# Patient Record
Sex: Female | Born: 1990 | Race: White | Hispanic: No | Marital: Single | Smoking: Current every day smoker
Health system: Southern US, Community
[De-identification: ages and names within clinical notes are randomized; demographics above are authoritative.]

---

## 2012-02-21 ENCOUNTER — Encounter (HOSPITAL_COMMUNITY): Payer: Self-pay | Admitting: *Deleted

## 2012-02-21 ENCOUNTER — Emergency Department (HOSPITAL_COMMUNITY)
Admission: EM | Admit: 2012-02-21 | Discharge: 2012-02-21 | Disposition: A | Discharge status: Home / Self Care | Payer: Self-pay | Attending: Emergency Medicine | Admitting: Emergency Medicine

## 2012-02-21 DIAGNOSIS — K0889 Other specified disorders of teeth and supporting structures: Secondary | ICD-10-CM

## 2012-02-21 DIAGNOSIS — K089 Disorder of teeth and supporting structures, unspecified: Secondary | ICD-10-CM | POA: Insufficient documentation

## 2012-02-21 MED ORDER — PENICILLIN V POTASSIUM 500 MG PO TABS: 500.0000 mg | ORAL_TABLET | Freq: Four times a day (QID) | ORAL | Status: AC

## 2012-02-21 MED ORDER — OXYCODONE-ACETAMINOPHEN 5-325 MG PO TABS: 1.0000 | ORAL_TABLET | ORAL | Status: AC | PRN

## 2012-02-21 NOTE — ED Provider Notes (Signed)
History  This chart was scribed for Joya Gaskins, MD by Shari Heritage. The patient was seen in room . Patient's care was started at 0915.     CSN: [id number]  Arrival date & time 02/21/12  0913   First MD Initiated Contact with Patient 02/21/12 0915      Chief Complaint  Patient presents with  . Dental Pain    Patient is a 21 y.o. female presenting with tooth pain. The history is provided by the patient. No language interpreter was used.  Dental PainThe primary symptoms include mouth pain. Primary symptoms do not include fever. The symptoms began 5 to 7 days ago. The symptoms are unchanged. The symptoms are new. The symptoms occur constantly.  Mouth pain began 5 - 7 days ago. Mouth pain occurs constantly. Mouth pain is unchanged. Affected locations include: teeth.    HPI Comments: Heidi Trujillo is a 21 y.o. female who presents to the Emergency Department complaining of moderate, constant, non-radiating left lower dental pain onset 1 week ago. Patient denies fever, nausea or vomiting. She states that she may have a filling missing. She says that she has tried to make an appointment with a dentist, but has been unsuccessful so far. She reports no other significant medical history. She has no known allergies to medication.  PMH - none  No past surgical history on file.  No family history on file.  History  Substance Use Topics  . Smoking status: Not on file  . Smokeless tobacco: Not on file  . Alcohol Use: Not on file    OB History    No data available      Review of Systems  Constitutional: Negative for fever.  HENT: Positive for dental problem.   Gastrointestinal: Negative for nausea and vomiting.    Allergies  Review of patient's allergies indicates not on file.  Home Medications  No current outpatient prescriptions on file.  BP 110/58  Pulse 88  Temp 98.4 F (36.9 C) (Oral)  Resp 16  Ht 5\' 4"  (1.626 m)  Wt 135 lb (61.236 kg)  BMI 23.17 kg/m2  SpO2  96%  Physical Exam CONSTITUTIONAL: Well developed/well nourished HEAD AND FACE: Normocephalic/atraumatic EYES: EOMI/PERRL ENMT: Mucous membranes moist.  No trismus.  No focal abscess noted. NECK: supple no meningeal signs CV: S1/S2 noted, no murmurs/rubs/gallops noted LUNGS: Lungs are clear to auscultation bilaterally, no apparent distress ABDOMEN: soft, nontender, no rebound or guarding NEURO: Pt is awake/alert, moves all extremitiesx4 EXTREMITIES:full ROM SKIN: warm, color normal   ED Course  Procedures  DIAGNOSTIC STUDIES: Oxygen Saturation is 96% on room air, adequate by my interpretation.    COORDINATION OF CARE: 9:27am- Patient informed of current plan for treatment and evaluation and agrees with plan at this time.        MDM  Nursing notes including past medical history and social history reviewed and considered in documentation .      I personally performed the services described in this documentation, which was scribed in my presence. The recorded information has been reviewed and considered.      Joya Gaskins, MD 02/21/12 1013

## 2012-02-21 NOTE — ED Notes (Signed)
Dental pain to left bottom x 1 wk.

## 2012-08-28 ENCOUNTER — Encounter (HOSPITAL_COMMUNITY): Payer: Self-pay | Admitting: Emergency Medicine

## 2012-08-28 ENCOUNTER — Emergency Department (HOSPITAL_COMMUNITY)
Admission: EM | Admit: 2012-08-28 | Discharge: 2012-08-28 | Disposition: A | Discharge status: Home / Self Care | Payer: Self-pay | Attending: Emergency Medicine | Admitting: Emergency Medicine

## 2012-08-28 DIAGNOSIS — R300 Dysuria: Secondary | ICD-10-CM | POA: Insufficient documentation

## 2012-08-28 DIAGNOSIS — N390 Urinary tract infection, site not specified: Secondary | ICD-10-CM | POA: Insufficient documentation

## 2012-08-28 DIAGNOSIS — R350 Frequency of micturition: Secondary | ICD-10-CM | POA: Insufficient documentation

## 2012-08-28 LAB — URINALYSIS, ROUTINE W REFLEX MICROSCOPIC
Bilirubin Urine: NEGATIVE
Glucose, UA: NEGATIVE mg/dL
Specific Gravity, Urine: >1.03 — ABNORMAL HIGH (ref 1.005–1.030)
pH: 6.5 (ref 5.0–8.0)

## 2012-08-28 LAB — URINE MICROSCOPIC-ADD ON

## 2012-08-28 MED ORDER — NITROFURANTOIN MONOHYD MACRO 100 MG PO CAPS: 100.0000 mg | ORAL_CAPSULE | Freq: Two times a day (BID) | ORAL | Status: DC

## 2012-08-28 MED ORDER — PHENAZOPYRIDINE HCL 200 MG PO TABS: 200.0000 mg | ORAL_TABLET | Freq: Three times a day (TID) | ORAL | Status: DC

## 2012-08-28 NOTE — ED Notes (Signed)
Pt alert & oriented x4, stable gait. Patient given discharge instructions, paperwork & prescription(s). Patient  instructed to stop at the registration desk to finish any additional paperwork. Patient verbalized understanding. Pt left department w/ no further questions. 

## 2012-08-28 NOTE — ED Provider Notes (Signed)
History     CSN: [id number]  Arrival date & time 08/28/12  0551   First MD Initiated Contact with Patient 08/28/12 [phone]      Chief Complaint  Patient presents with  . Urinary Tract Infection    (Consider location/radiation/quality/duration/timing/severity/associated sxs/prior treatment) HPI Heidi Trujillo is a 22 y.o. female presenting with dysuria and frequency, she says it feels like "razor blades" when she urinates, 7/10 in intensity this is been going on for 3 days. She took is no over-the-counter for 2 days without resolution she also has been taking penicillin for 1 day. Denies any fevers, chills, confusion, dizziness, chest pain, shortness of breath. No abdominal pain. No other alleviating or exacerbating factors and no other associated symptoms. No rash.  History reviewed. No pertinent past medical history.  Past Surgical History  Procedure Laterality Date  . Cesarean section      No family history on file.  History  Substance Use Topics  . Smoking status: Never Smoker   . Smokeless tobacco: Not on file  . Alcohol Use: No    OB History   Grav Para Term Preterm Abortions TAB SAB Ect Mult Living                  Review of Systems At least 10pt or greater review of systems completed and are negative except where specified in the HPI.  Allergies  Review of patient's allergies indicates no known allergies.  Home Medications  No current outpatient prescriptions on file.  BP 120/64  Pulse 77  Temp(Src) 97.9 F (36.6 C) (Oral)  Resp 20  Ht 5\' 4"  (1.626 m)  Wt 145 lb (65.772 kg)  BMI 24.88 kg/m2  SpO2 96%  Physical Exam  Nursing notes reviewed.  Electronic medical record reviewed. VITAL SIGNS:   Filed Vitals:   08/28/12 0600  BP: 120/64  Pulse: 77  Temp: 97.9 F (36.6 C)  TempSrc: Oral  Resp: 20  Height: 5\' 4"  (1.626 m)  Weight: 145 lb (65.772 kg)  SpO2: 96%   CONSTITUTIONAL: Awake, oriented, appears non-toxic HENT: Atraumatic,  normocephalic, oral mucosa pink and moist, airway patent. Nares patent without drainage. External ears normal. EYES: Conjunctiva clear, EOMI, PERRLA NECK: Trachea midline, non-tender, supple CARDIOVASCULAR: Normal heart rate, Normal rhythm, No murmurs, rubs, gallops PULMONARY/CHEST: Clear to auscultation, no rhonchi, wheezes, or rales. Symmetrical breath sounds. Non-tender. ABDOMINAL: Non-distended, soft, non-tender - no rebound or guarding.  BS normal. No CVA tenderness. NEUROLOGIC: Non-focal, moving all four extremities, no gross sensory or motor deficits. EXTREMITIES: No clubbing, cyanosis, or edema SKIN: Warm, Dry, No erythema, No rash  ED Course  Procedures (including critical care time)  Labs Reviewed  URINALYSIS, ROUTINE W REFLEX MICROSCOPIC - Abnormal; Notable for the following:    Specific Gravity, Urine >1.030 (*)    Hgb urine dipstick LARGE (*)    Protein, ur 100 (*)    Leukocytes, UA MODERATE (*)    All other components within normal limits  URINE MICROSCOPIC-ADD ON - Abnormal; Notable for the following:    Bacteria, UA MANY (*)    All other components within normal limits  URINE CULTURE   No results found.   1. UTI (lower urinary tract infection)       MDM  Patient arrives to the ER with a UTI type symptoms-urinalysis is consistent with UTI, will treat patient with antibiotics for 5 days and Pyridium for 2 days.   Urine culture obtained. I explained the diagnosis and have given explicit  precautions to return to the ER including any other new or worsening symptoms. The patient understands and accepts the medical plan as it's been dictated and I have answered their questions. Discharge instructions concerning home care and prescriptions have been given.  The patient is STABLE and is discharged to home in good condition.     Jones Skene, MD 08/29/12 2218

## 2012-08-28 NOTE — ED Notes (Signed)
Patient c/o urinary tract infection symptoms.  Patient states it feels like razors when she urinates.

## 2012-08-28 NOTE — ED Notes (Signed)
Pt reports burning w/ urination. Pt has been taking OTC AZO for symptoms, took some left over penicillin tabs she had but pain has returned.

## 2012-08-31 LAB — URINE CULTURE

## 2012-09-01 ENCOUNTER — Telehealth (HOSPITAL_COMMUNITY): Payer: Self-pay | Admitting: Emergency Medicine

## 2012-09-01 NOTE — ED Notes (Signed)
+  Urine. Patient treated with Macrobid. Sensitive to same. Per protocol MD. °

## 2012-09-01 NOTE — ED Notes (Signed)
Patient has +Urine culture. °

## 2013-01-31 ENCOUNTER — Emergency Department (HOSPITAL_COMMUNITY): Payer: Self-pay

## 2013-01-31 ENCOUNTER — Emergency Department (HOSPITAL_COMMUNITY)
Admission: EM | Admit: 2013-01-31 | Discharge: 2013-01-31 | Disposition: A | Discharge status: Home / Self Care | Payer: Self-pay | Attending: Emergency Medicine | Admitting: Emergency Medicine

## 2013-01-31 ENCOUNTER — Encounter (HOSPITAL_COMMUNITY): Payer: Self-pay

## 2013-01-31 DIAGNOSIS — R6883 Chills (without fever): Secondary | ICD-10-CM | POA: Insufficient documentation

## 2013-01-31 DIAGNOSIS — F17200 Nicotine dependence, unspecified, uncomplicated: Secondary | ICD-10-CM | POA: Insufficient documentation

## 2013-01-31 DIAGNOSIS — R110 Nausea: Secondary | ICD-10-CM | POA: Insufficient documentation

## 2013-01-31 DIAGNOSIS — N10 Acute tubulo-interstitial nephritis: Secondary | ICD-10-CM | POA: Insufficient documentation

## 2013-01-31 DIAGNOSIS — Z3202 Encounter for pregnancy test, result negative: Secondary | ICD-10-CM | POA: Insufficient documentation

## 2013-01-31 LAB — CBC WITH DIFFERENTIAL/PLATELET
Basophils Absolute: 0 10*3/uL (ref 0.0–0.1)
HCT: 40.5 % (ref 36.0–46.0)
Lymphocytes Relative: 14 % (ref 12–46)
Lymphs Abs: 1.6 10*3/uL (ref 0.7–4.0)
MCV: 87.5 fL (ref 78.0–100.0)
Neutro Abs: 8.3 10*3/uL — ABNORMAL HIGH (ref 1.7–7.7)
Platelets: 293 10*3/uL (ref 150–400)
RBC: 4.63 MIL/uL (ref 3.87–5.11)
RDW: 12 % (ref 11.5–15.5)
WBC: 10.9 10*3/uL — ABNORMAL HIGH (ref 4.0–10.5)

## 2013-01-31 LAB — URINE MICROSCOPIC-ADD ON

## 2013-01-31 LAB — BASIC METABOLIC PANEL
CO2: 24 mEq/L (ref 19–32)
Chloride: 101 mEq/L (ref 96–112)
Glucose, Bld: 100 mg/dL — ABNORMAL HIGH (ref 70–99)
Sodium: 137 mEq/L (ref 135–145)

## 2013-01-31 LAB — URINALYSIS, ROUTINE W REFLEX MICROSCOPIC
Bilirubin Urine: NEGATIVE
Specific Gravity, Urine: 1.01 (ref 1.005–1.030)
Urobilinogen, UA: 0.2 mg/dL (ref 0.0–1.0)
pH: 6 (ref 5.0–8.0)

## 2013-01-31 LAB — PREGNANCY, URINE: Preg Test, Ur: NEGATIVE

## 2013-01-31 MED ORDER — IBUPROFEN 800 MG PO TABS
800.0000 mg | ORAL_TABLET | Freq: Once | ORAL | Status: AC
  Administered 2013-01-31: 800 mg via ORAL

## 2013-01-31 MED ORDER — HYDROCODONE-ACETAMINOPHEN 5-325 MG PO TABS: 1.0000 | ORAL_TABLET | ORAL | Status: DC | PRN

## 2013-01-31 MED ORDER — DEXTROSE 5 % IV SOLN
1.0000 g | Freq: Once | INTRAVENOUS | Status: AC
  Administered 2013-01-31: 1 g via INTRAVENOUS
  Filled 2013-01-31: qty 10

## 2013-01-31 MED ORDER — SULFAMETHOXAZOLE-TRIMETHOPRIM 800-160 MG PO TABS: 1.0000 | ORAL_TABLET | Freq: Two times a day (BID) | ORAL | Status: DC

## 2013-01-31 MED ORDER — IBUPROFEN 800 MG PO TABS
ORAL_TABLET | ORAL | Status: AC
  Filled 2013-01-31: qty 1

## 2013-01-31 NOTE — ED Notes (Signed)
Pt reports right side chest wall pain for 3 days, pain is worse w/ deep breaths, denies any cough, no fever, chills at home. +nausea

## 2013-01-31 NOTE — ED Provider Notes (Signed)
Medical screening examination/treatment/procedure(s) were performed by non-physician practitioner and as supervising physician I was immediately available for consultation/collaboration.  Lashon Hillier, MD 01/31/13 1531 
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
## 2013-02-02 LAB — URINE CULTURE

## 2013-02-03 NOTE — ED Notes (Signed)
Patient treated with Sulfa-Trim-sensitive to same-chart appended per protocol MD

## 2013-06-27 ENCOUNTER — Encounter (HOSPITAL_COMMUNITY): Payer: Self-pay | Admitting: Emergency Medicine

## 2013-06-27 ENCOUNTER — Emergency Department (HOSPITAL_COMMUNITY)
Admission: EM | Admit: 2013-06-27 | Discharge: 2013-06-27 | Disposition: A | Discharge status: Home / Self Care | Payer: Self-pay | Attending: Emergency Medicine | Admitting: Emergency Medicine

## 2013-06-27 DIAGNOSIS — Z87891 Personal history of nicotine dependence: Secondary | ICD-10-CM | POA: Insufficient documentation

## 2013-06-27 DIAGNOSIS — L259 Unspecified contact dermatitis, unspecified cause: Secondary | ICD-10-CM | POA: Insufficient documentation

## 2013-06-27 DIAGNOSIS — L309 Dermatitis, unspecified: Secondary | ICD-10-CM

## 2013-06-27 MED ORDER — TRIAMCINOLONE ACETONIDE 0.1 % EX CREA: 1.0000 "application " | TOPICAL_CREAM | Freq: Three times a day (TID) | CUTANEOUS | Status: DC

## 2013-06-27 MED ORDER — PREDNISONE 10 MG PO TABS: ORAL_TABLET | ORAL | Status: DC

## 2013-06-27 NOTE — ED Provider Notes (Signed)
CSN: [id number]     Arrival date & time 06/27/13  1452 History   First MD Initiated Contact with Patient 06/27/13 1623     Chief Complaint  Patient presents with  . Rash     (Consider location/radiation/quality/duration/timing/severity/associated sxs/prior Treatment) Patient is a 23 y.o. female presenting with rash. The history is provided by the patient.  Rash Location: the bends of the legs, bends of the arms, and under the arm pits. Quality: dryness, itchiness and redness   Quality: not weeping   Severity:  Moderate Onset quality:  Gradual Duration:  3 days Timing:  Constant Progression:  Worsening Chronicity:  New Context: sick contacts   Context: not medications, not new detergent/soap and not nuts   Relieved by:  Nothing Worsened by:  Nothing tried Ineffective treatments:  Anti-itch cream Associated symptoms: no abdominal pain, no fatigue, no fever, no joint pain, no myalgias, no nausea, no shortness of breath, no throat swelling, no tongue swelling and not wheezing     History reviewed. No pertinent past medical history. Past Surgical History  Procedure Laterality Date  . Cesarean section     History reviewed. No pertinent family history. History  Substance Use Topics  . Smoking status: Former Smoker    Types: Cigarettes  . Smokeless tobacco: Not on file  . Alcohol Use: No   OB History   Grav Para Term Preterm Abortions TAB SAB Ect Mult Living                 Review of Systems  Constitutional: Negative for fever, activity change and fatigue.       All ROS Neg except as noted in HPI  HENT: Negative for nosebleeds.   Eyes: Negative for photophobia and discharge.  Respiratory: Negative for cough, shortness of breath and wheezing.   Cardiovascular: Negative for chest pain and palpitations.  Gastrointestinal: Negative for nausea, abdominal pain and blood in stool.  Genitourinary: Negative for dysuria, frequency and hematuria.  Musculoskeletal: Negative for  arthralgias, back pain, myalgias and neck pain.  Skin: Positive for rash.  Neurological: Negative for dizziness, seizures and speech difficulty.  Psychiatric/Behavioral: Negative for hallucinations and confusion.      Allergies  Review of patient's allergies indicates no known allergies.  Home Medications   Current Outpatient Rx  Name  Route  Sig  Dispense  Refill  . Aspirin-Acetaminophen-Caffeine (GOODYS EXTRA STRENGTH PO)   Oral   Take 1 Package by mouth daily as needed (pain).         Marland Kitchen. HYDROcodone-acetaminophen (NORCO/VICODIN) 5-325 MG per tablet   Oral   Take 1 tablet by mouth every 4 (four) hours as needed for pain.   15 tablet   0   . ibuprofen (ADVIL,MOTRIN) 200 MG tablet   Oral   Take 800 mg by mouth every 6 (six) hours as needed for pain.         Marland Kitchen. sulfamethoxazole-trimethoprim (SEPTRA DS) 800-160 MG per tablet   Oral   Take 1 tablet by mouth every 12 (twelve) hours.   28 tablet   0    BP 97/67  Pulse 88  Temp(Src) 98.4 F (36.9 C) (Oral)  Resp 17  SpO2 97% Physical Exam  Nursing note and vitals reviewed. Constitutional: She is oriented to person, place, and time. She appears well-developed and well-nourished.  Non-toxic appearance.  HENT:  Head: Normocephalic.  Right Ear: Tympanic membrane and external ear normal.  Left Ear: Tympanic membrane and external ear normal.  Eyes: EOM  and lids are normal. Pupils are equal, round, and reactive to light.  Neck: Normal range of motion. Neck supple. Carotid bruit is not present.  Cardiovascular: Normal rate, regular rhythm, normal heart sounds, intact distal pulses and normal pulses.   Pulmonary/Chest: Breath sounds normal. No respiratory distress.  Abdominal: Soft. Bowel sounds are normal. There is no tenderness. There is no guarding.  Musculoskeletal: Normal range of motion.  Lymphadenopathy:       Head (right side): No submandibular adenopathy present.       Head (left side): No submandibular adenopathy  present.    She has no cervical adenopathy.  Neurological: She is alert and oriented to person, place, and time. She has normal strength. No cranial nerve deficit or sensory deficit.  Skin: Skin is warm and dry. Rash noted. Rash is maculopapular. Rash is not pustular and not urticarial.  Red dry, scaling rash noted behind the right and left knees, at the right and left antecubitals and both arm pits. No red streaks.  Psychiatric: She has a normal mood and affect. Her speech is normal.    ED Course  Procedures (including critical care time) Labs Review Labs Reviewed - No data to display Imaging Review No results found.  EKG Interpretation   None       MDM   Final diagnoses:  None    *I have reviewed nursing notes, vital signs, and all appropriate lab and imaging results for this patient.**  Exam is consistent with eczema. Rx for triamcinolone, and prednisone given. Pt advised to use allegra and/or benadryl for itching. She will return if any changes or problem.  Kathie Dike, PA-C 06/27/13 1659

## 2013-06-27 NOTE — ED Notes (Signed)
All ready seen by PA and ready for d/c

## 2013-06-27 NOTE — ED Notes (Signed)
Rash to bends of arms, legs and under arm pits x 2 days. Has tried ointments and lotions with no relief. C/o burning.

## 2013-06-27 NOTE — Discharge Instructions (Signed)
Please use prednisone taper as prescribed. Use triamcinolone daily to rash areas. Use allegra, zyrtec, or benadryl for itching if needed. Eczema Eczema, also called atopic dermatitis, is a skin disorder that causes inflammation of the skin. It causes a red rash and dry, scaly skin. The skin becomes very itchy. Eczema is generally worse during the cooler winter months and often improves with the warmth of summer. Eczema usually starts showing signs in infancy. Some children outgrow eczema, but it may last through adulthood.  CAUSES  The exact cause of eczema is not known, but it appears to run in families. People with eczema often have a family history of eczema, allergies, asthma, or hay fever. Eczema is not contagious. Flare-ups of the condition may be caused by:   Contact with something you are sensitive or allergic to.   Stress. SIGNS AND SYMPTOMS  Dry, scaly skin.   Red, itchy rash.   Itchiness. This may occur before the skin rash and may be very intense.  DIAGNOSIS  The diagnosis of eczema is usually made based on symptoms and medical history. TREATMENT  Eczema cannot be cured, but symptoms usually can be controlled with treatment and other strategies. A treatment plan might include:  Controlling the itching and scratching.   Use over-the-counter antihistamines as directed for itching. This is especially useful at night when the itching tends to be worse.   Use over-the-counter steroid creams as directed for itching.   Avoid scratching. Scratching makes the rash and itching worse. It may also result in a skin infection (impetigo) due to a break in the skin caused by scratching.   Keeping the skin well moisturized with creams every day. This will seal in moisture and help prevent dryness. Lotions that contain alcohol and water should be avoided because they can dry the skin.   Limiting exposure to things that you are sensitive or allergic to (allergens).   Recognizing  situations that cause stress.   Developing a plan to manage stress.  HOME CARE INSTRUCTIONS   Only take over-the-counter or prescription medicines as directed by your health care provider.   Do not use anything on the skin without checking with your health care provider.   Keep baths or showers short (5 minutes) in warm (not hot) water. Use mild cleansers for bathing. These should be unscented. You may add nonperfumed bath oil to the bath water. It is best to avoid soap and bubble bath.   Immediately after a bath or shower, when the skin is still damp, apply a moisturizing ointment to the entire body. This ointment should be a petroleum ointment. This will seal in moisture and help prevent dryness. The thicker the ointment, the better. These should be unscented.   Keep fingernails cut short. Children with eczema may need to wear soft gloves or mittens at night after applying an ointment.   Dress in clothes made of cotton or cotton blends. Dress lightly, because heat increases itching.   A child with eczema should stay away from anyone with fever blisters or cold sores. The virus that causes fever blisters (herpes simplex) can cause a serious skin infection in children with eczema. SEEK MEDICAL CARE IF:   Your itching interferes with sleep.   Your rash gets worse or is not better within 1 week after starting treatment.   You see pus or soft yellow scabs in the rash area.   You have a fever.   You have a rash flare-up after contact with someone who  has fever blisters.  Document Released: 04/21/2000 Document Revised: 02/12/2013 Document Reviewed: 11/25/2012 [facility] Patient Information 2014 [place], [place].

## 2013-06-27 NOTE — ED Provider Notes (Signed)
History/physical exam/procedure(s) were performed by non-physician practitioner and as supervising physician I was immediately available for consultation/collaboration. I have reviewed all notes and am in agreement with care and plan.   Kirston S Dewayne Severe, MD 06/27/13 2336 

## 2014-02-05 ENCOUNTER — Other Ambulatory Visit (HOSPITAL_COMMUNITY): Payer: Self-pay | Admitting: *Deleted

## 2014-02-05 DIAGNOSIS — IMO0002 Reserved for concepts with insufficient information to code with codable children: Secondary | ICD-10-CM

## 2014-02-05 DIAGNOSIS — R229 Localized swelling, mass and lump, unspecified: Principal | ICD-10-CM

## 2014-02-10 ENCOUNTER — Other Ambulatory Visit (HOSPITAL_COMMUNITY): Payer: Self-pay | Admitting: *Deleted

## 2014-02-10 ENCOUNTER — Ambulatory Visit (HOSPITAL_COMMUNITY)
Admission: RE | Admit: 2014-02-10 | Discharge: 2014-02-10 | Disposition: A | Discharge status: Home / Self Care | Payer: PRIVATE HEALTH INSURANCE | Source: Ambulatory Visit | Attending: *Deleted | Admitting: *Deleted

## 2014-02-10 DIAGNOSIS — IMO0002 Reserved for concepts with insufficient information to code with codable children: Secondary | ICD-10-CM

## 2014-02-10 DIAGNOSIS — R229 Localized swelling, mass and lump, unspecified: Principal | ICD-10-CM

## 2014-02-10 DIAGNOSIS — N63 Unspecified lump in breast: Secondary | ICD-10-CM | POA: Diagnosis present

## 2014-10-08 ENCOUNTER — Other Ambulatory Visit (HOSPITAL_COMMUNITY): Payer: Self-pay | Admitting: *Deleted

## 2014-10-08 DIAGNOSIS — Z09 Encounter for follow-up examination after completed treatment for conditions other than malignant neoplasm: Secondary | ICD-10-CM

## 2014-10-20 ENCOUNTER — Ambulatory Visit (HOSPITAL_COMMUNITY): Admission: RE | Admit: 2014-10-20 | Payer: Self-pay | Source: Ambulatory Visit

## 2015-03-18 ENCOUNTER — Encounter (HOSPITAL_COMMUNITY): Payer: Self-pay | Admitting: Emergency Medicine

## 2015-03-18 ENCOUNTER — Emergency Department (HOSPITAL_COMMUNITY)
Admission: EM | Admit: 2015-03-18 | Discharge: 2015-03-18 | Disposition: A | Discharge status: Home / Self Care | Payer: Medicaid Other | Attending: Emergency Medicine | Admitting: Emergency Medicine

## 2015-03-18 ENCOUNTER — Emergency Department (HOSPITAL_COMMUNITY): Payer: Medicaid Other

## 2015-03-18 DIAGNOSIS — J209 Acute bronchitis, unspecified: Secondary | ICD-10-CM | POA: Diagnosis not present

## 2015-03-18 DIAGNOSIS — Z720 Tobacco use: Secondary | ICD-10-CM | POA: Insufficient documentation

## 2015-03-18 DIAGNOSIS — R05 Cough: Secondary | ICD-10-CM | POA: Diagnosis present

## 2015-03-18 MED ORDER — BENZONATATE 100 MG PO CAPS: 200.0000 mg | ORAL_CAPSULE | Freq: Three times a day (TID) | ORAL | Status: AC | PRN

## 2015-03-18 MED ORDER — NAPROXEN 500 MG PO TABS: 500.0000 mg | ORAL_TABLET | Freq: Two times a day (BID) | ORAL | Status: AC

## 2015-03-18 MED ORDER — ALBUTEROL SULFATE HFA 108 (90 BASE) MCG/ACT IN AERS
2.0000 | INHALATION_SPRAY | Freq: Once | RESPIRATORY_TRACT | Status: AC
Start: 2015-03-18 — End: 2015-03-18
  Administered 2015-03-18: 2 via RESPIRATORY_TRACT
  Filled 2015-03-18: qty 6.7

## 2015-03-18 MED ORDER — BENZONATATE 100 MG PO CAPS
200.0000 mg | ORAL_CAPSULE | Freq: Once | ORAL | Status: AC
  Administered 2015-03-18: 200 mg via ORAL
  Filled 2015-03-18: qty 2

## 2015-03-18 NOTE — Discharge Instructions (Signed)
Acute Bronchitis Bronchitis is inflammation of the airways that extend from the windpipe into the lungs (bronchi). The inflammation often causes mucus to develop. This leads to a cough, which is the most common symptom of bronchitis.  In acute bronchitis, the condition usually develops suddenly and goes away over time, usually in a couple weeks. Smoking, allergies, and asthma can make bronchitis worse. Repeated episodes of bronchitis may cause further lung problems.  CAUSES Acute bronchitis is most often caused by the same virus that causes a cold. The virus can spread from person to person (contagious) through coughing, sneezing, and touching contaminated objects. SIGNS AND SYMPTOMS   Cough.   Fever.   Coughing up mucus.   Body aches.   Chest congestion.   Chills.   Shortness of breath.   Sore throat.  DIAGNOSIS  Acute bronchitis is usually diagnosed through a physical exam. Your health care provider will also ask you questions about your medical history. Tests, such as chest X-rays, are sometimes done to rule out other conditions.  TREATMENT  Acute bronchitis usually goes away in a couple weeks. Oftentimes, no medical treatment is necessary. Medicines are sometimes given for relief of fever or cough. Antibiotic medicines are usually not needed but may be prescribed in certain situations. In some cases, an inhaler may be recommended to help reduce shortness of breath and control the cough. A cool mist vaporizer may also be used to help thin bronchial secretions and make it easier to clear the chest.  HOME CARE INSTRUCTIONS  Get plenty of rest.   Drink enough fluids to keep your urine clear or pale yellow (unless you have a medical condition that requires fluid restriction). Increasing fluids may help thin your respiratory secretions (sputum) and reduce chest congestion, and it will prevent dehydration.   Take medicines only as directed by your health care provider.  If  you were prescribed an antibiotic medicine, finish it all even if you start to feel better.  Avoid smoking and secondhand smoke. Exposure to cigarette smoke or irritating chemicals will make bronchitis worse. If you are a smoker, consider using nicotine gum or skin patches to help control withdrawal symptoms. Quitting smoking will help your lungs heal faster.   Reduce the chances of another bout of acute bronchitis by washing your hands frequently, avoiding people with cold symptoms, and trying not to touch your hands to your mouth, nose, or eyes.   Keep all follow-up visits as directed by your health care provider.  SEEK MEDICAL CARE IF: Your symptoms do not improve after 1 week of treatment.  SEEK IMMEDIATE MEDICAL CARE IF:  You develop an increased fever or chills.   You have chest pain.   You have severe shortness of breath.  You have bloody sputum.   You develop dehydration.  You faint or repeatedly feel like you are going to pass out.  You develop repeated vomiting.  You develop a severe headache. MAKE SURE YOU:   Understand these instructions.  Will watch your condition.  Will get help right away if you are not doing well or get worse.   This information is not intended to replace advice given to you by your health care provider. Make sure you discuss any questions you have with your health care provider.   Document Released: 06/01/2004 Document Revised: 05/15/2014 Document Reviewed: 10/15/2012 Elsevier Interactive Patient Education 2016 [employer]   Take 2 puffs of your inhaler every 4 hours if you are coughing or wheezing.

## 2015-03-18 NOTE — ED Notes (Signed)
Patient complaining of cough x 1 week with chest aching in upper part of chest during deep breathing and cough.

## 2015-03-21 ENCOUNTER — Encounter (HOSPITAL_COMMUNITY): Payer: Self-pay | Admitting: Emergency Medicine

## 2015-03-21 ENCOUNTER — Emergency Department (HOSPITAL_COMMUNITY)
Admission: EM | Admit: 2015-03-21 | Discharge: 2015-03-21 | Disposition: A | Discharge status: Home / Self Care | Payer: Medicaid Other | Attending: Emergency Medicine | Admitting: Emergency Medicine

## 2015-03-21 DIAGNOSIS — F17210 Nicotine dependence, cigarettes, uncomplicated: Secondary | ICD-10-CM | POA: Diagnosis not present

## 2015-03-21 DIAGNOSIS — M542 Cervicalgia: Secondary | ICD-10-CM | POA: Diagnosis not present

## 2015-03-21 DIAGNOSIS — Z79899 Other long term (current) drug therapy: Secondary | ICD-10-CM | POA: Insufficient documentation

## 2015-03-21 DIAGNOSIS — J40 Bronchitis, not specified as acute or chronic: Secondary | ICD-10-CM | POA: Insufficient documentation

## 2015-03-21 DIAGNOSIS — G44209 Tension-type headache, unspecified, not intractable: Secondary | ICD-10-CM

## 2015-03-21 DIAGNOSIS — M62471 Contracture of muscle, right ankle and foot: Secondary | ICD-10-CM | POA: Insufficient documentation

## 2015-03-21 DIAGNOSIS — R51 Headache: Secondary | ICD-10-CM | POA: Insufficient documentation

## 2015-03-21 MED ORDER — METHOCARBAMOL 500 MG PO TABS: 500.0000 mg | ORAL_TABLET | Freq: Four times a day (QID) | ORAL | Status: AC | PRN

## 2015-03-21 NOTE — ED Provider Notes (Signed)
CSN: [id number]     Arrival date & time 03/21/15  2056 History   First MD Initiated Contact with Patient 03/21/15 2121     Chief Complaint  Patient presents with  . Headache     (Consider location/radiation/quality/duration/timing/severity/associated sxs/prior Treatment) Patient is a 24 y.o. female presenting with headaches. The history is provided by the patient.  Headache Pain location:  Occipital Quality:  Sharp (burning) Radiates to: ears ringing. Severity currently:  1/10 Severity at highest:  10/10 Onset quality:  Gradual Duration:  3 days Timing:  Intermittent Progression:  Worsening Chronicity:  New Context: coughing   Context comment:  Pain comes after cough or bending over. Associated symptoms: congestion, cough, neck pain, sinus pressure and URI   Associated symptoms: no abdominal pain, no facial pain, no syncope and no vomiting     History reviewed. No pertinent past medical history. Past Surgical History  Procedure Laterality Date  . Cesarean section     No family history on file. Social History  Substance Use Topics  . Smoking status: Current Every Day Smoker    Types: Cigarettes  . Smokeless tobacco: None  . Alcohol Use: No   OB History    No data available     Review of Systems  HENT: Positive for congestion and sinus pressure.   Respiratory: Positive for cough.   Cardiovascular: Negative for syncope.  Gastrointestinal: Negative for vomiting and abdominal pain.  Musculoskeletal: Positive for neck pain.  Neurological: Positive for headaches.  All other systems reviewed and are negative.     Allergies  Review of patient's allergies indicates no known allergies.  Home Medications   Prior to Admission medications   Medication Sig Start Date End Date Taking? Authorizing Provider  albuterol (PROVENTIL HFA;VENTOLIN HFA) 108 (90 BASE) MCG/ACT inhaler Inhale 1-2 puffs into the lungs every 6 (six) hours as needed for wheezing or shortness of  breath.   Yes Historical Provider, MD  Aspirin-Acetaminophen-Caffeine (GOODYS EXTRA STRENGTH PO) Take 1 Package by mouth daily as needed (pain).   Yes Historical Provider, MD  benzonatate (TESSALON) 100 MG capsule Take 2 capsules (200 mg total) by mouth 3 (three) times daily as needed. 03/18/15  Yes Raynelle Fanning Idol, PA-C  ibuprofen (ADVIL,MOTRIN) 200 MG tablet Take 800 mg by mouth every 6 (six) hours as needed for pain.   Yes Historical Provider, MD  ibuprofen (ADVIL,MOTRIN) 800 MG tablet Take 800 mg by mouth every 8 (eight) hours as needed for mild pain.   Yes Historical Provider, MD  naproxen (NAPROSYN) 500 MG tablet Take 1 tablet (500 mg total) by mouth 2 (two) times daily. 03/18/15  Yes Raynelle Fanning Idol, PA-C   BP 117/73 mmHg  Pulse 79  Temp(Src) 98.3 F (36.8 C) (Oral)  Resp 18  Ht  (1.6 m)  Wt 170 lb (77.111 kg)  BMI 30.12 kg/m2  SpO2 99% Physical Exam  Constitutional: She is oriented to person, place, and time. She appears well-developed and well-nourished.  Non-toxic appearance.  HENT:  Head: Normocephalic.  Right Ear: Tympanic membrane and external ear normal.  Left Ear: Tympanic membrane and external ear normal.  Mild nasal congestion present.  Eyes: EOM and lids are normal. Pupils are equal, round, and reactive to light.  Neck: Normal range of motion. Neck supple. Carotid bruit is not present.  No carotid bruit appreciated.  Cardiovascular: Normal rate, regular rhythm, normal heart sounds, intact distal pulses and normal pulses.   Pulmonary/Chest: Breath sounds normal. No respiratory distress. She has no  wheezes.  Abdominal: Soft. Bowel sounds are normal. There is no tenderness. There is no guarding.  Musculoskeletal: Normal range of motion.  There is muscle spasm of the upper left trapezius extending to the occipital area.  Lymphadenopathy:       Head (right side): No submandibular adenopathy present.       Head (left side): No submandibular adenopathy present.    She has no  cervical adenopathy.  Neurological: She is alert and oriented to person, place, and time. She has normal strength. No cranial nerve deficit or sensory deficit. She exhibits normal muscle tone. Coordination normal.  Gait steady and intact. No foot drop noted. No problems with balance or coordination. Speech is clear and understandable.  Skin: Skin is warm and dry.  Psychiatric: She has a normal mood and affect. Her speech is normal.  Nursing note and vitals reviewed.   ED Course  Procedures (including critical care time) Labs Review Labs Reviewed - No data to display  Imaging Review No results found. I have personally reviewed and evaluated these images and lab results as part of my medical decision-making.   EKG Interpretation None      MDM  Vital signs within normal limits. Pulse oximetry is 99% on room air. Within normal limits by my interpretation. The examination suggests a muscle contraction type headache, with palpable spasm noted in the upper trapezius, and extending into the occipital area. The patient is reassured of a neurologic examination. Patient will be treated with Robaxin and naproxen. The patient will continue her cough medications and medications for her bronchitis. She is to see her primary physician, or return to the emergency department if any changes, problems, or concerns.    Final diagnoses:  None    **I have reviewed nursing notes, vital signs, and all appropriate lab and imaging results for this patient.    Ivery QualeHobson Honorio Devol, PA-C 03/21/15 2157  Linwood DibblesJon Knapp, MD 03/25/15 1247

## 2015-03-21 NOTE — Discharge Instructions (Signed)
Vital signs were within normal limits. Your examination suggest a muscle contraction type headache that is aggravated by straining to cough. Please continue your current medications. four Your bronchitis and cough. Use Robaxin every 6 hours as needed for the muscle contraction headache. This medication may cause drowsiness, please do not drink alcohol, drive, operative machinery, or participate in activities requiring concentration when taking this medication.

## 2015-03-21 NOTE — ED Notes (Signed)
Dx with bronchitis Thursday night here, pt continues to cough, states she has burning pain back of her head after cough, ear ringing and feel pressure in ears and head

## 2015-03-21 NOTE — ED Provider Notes (Signed)
CSN: [id number]     Arrival date & time 03/18/15  1921 History   First MD Initiated Contact with Patient 03/18/15 2024     Chief Complaint  Patient presents with  . Cough  . Shortness of Breath     (Consider location/radiation/quality/duration/timing/severity/associated sxs/prior Treatment) The history is provided by the patient.   Heidi Trujillo is a 24 y.o. female smoker with no other significant past medical history presenting with a one week history of persistent non productive cough along with upper burning chest pain with cough and deep inspiration. She also endorses feeling chest tightness, shortness of breath and wheezing most prominent at night when trying to sleep.she denies fevers or chills, sore throat, or nasal congestion and drainage. She has taken ibuprofen and dayquill without relief.  She has not smoked in 3 days.     History reviewed. No pertinent past medical history. Past Surgical History  Procedure Laterality Date  . Cesarean section     History reviewed. No pertinent family history. Social History  Substance Use Topics  . Smoking status: Current Every Day Smoker    Types: Cigarettes  . Smokeless tobacco: None  . Alcohol Use: No   OB History    No data available     Review of Systems  Constitutional: Negative for fever and chills.  HENT: Negative for congestion, ear pain, rhinorrhea, sinus pressure, sore throat, trouble swallowing and voice change.   Eyes: Negative for discharge.  Respiratory: Positive for cough, chest tightness and wheezing. Negative for shortness of breath and stridor.   Cardiovascular: Negative for chest pain.  Gastrointestinal: Negative for nausea, vomiting and abdominal pain.  Genitourinary: Negative.   Musculoskeletal: Negative for arthralgias.      Allergies  Review of patient's allergies indicates no known allergies.  Home Medications   Prior to Admission medications   Medication Sig Start Date End Date Taking?  Authorizing Provider  ibuprofen (ADVIL,MOTRIN) 800 MG tablet Take 800 mg by mouth every 8 (eight) hours as needed for mild pain.   Yes Historical Provider, MD  Aspirin-Acetaminophen-Caffeine (GOODYS EXTRA STRENGTH PO) Take 1 Package by mouth daily as needed (pain).    Historical Provider, MD  benzonatate (TESSALON) 100 MG capsule Take 2 capsules (200 mg total) by mouth 3 (three) times daily as needed. 03/18/15   Burgess Amor, PA-C  HYDROcodone-acetaminophen (NORCO/VICODIN) 5-325 MG per tablet Take 1 tablet by mouth every 4 (four) hours as needed for pain. Patient not taking: Reported on 03/18/2015 01/31/13   Burgess Amor, PA-C  ibuprofen (ADVIL,MOTRIN) 200 MG tablet Take 800 mg by mouth every 6 (six) hours as needed for pain.    Historical Provider, MD  naproxen (NAPROSYN) 500 MG tablet Take 1 tablet (500 mg total) by mouth 2 (two) times daily. 03/18/15   Burgess Amor, PA-C  predniSONE (DELTASONE) 10 MG tablet 6,5,4,3,2,1 - take with food Patient not taking: Reported on 03/18/2015 06/27/13   Ivery Quale, PA-C  sulfamethoxazole-trimethoprim (SEPTRA DS) 800-160 MG per tablet Take 1 tablet by mouth every 12 (twelve) hours. Patient not taking: Reported on 03/18/2015 01/31/13   Burgess Amor, PA-C  triamcinolone cream (KENALOG) 0.1 % Apply 1 application topically 3 (three) times daily. Patient not taking: Reported on 03/18/2015 06/27/13   Ivery Quale, PA-C   BP 124/70 mmHg  Pulse 86  Temp(Src) 98.3 F (36.8 C) (Oral)  Resp 18  Ht  (1.6 m)  Wt 170 lb (77.111 kg)  BMI 30.12 kg/m2  SpO2 98% Physical Exam  Constitutional:  She is oriented to person, place, and time. She appears well-developed and well-nourished.  HENT:  Head: Normocephalic and atraumatic.  Right Ear: Tympanic membrane and ear canal normal.  Left Ear: Tympanic membrane and ear canal normal.  Nose: Mucosal edema and rhinorrhea present.  Mouth/Throat: Uvula is midline, oropharynx is clear and moist and mucous membranes are normal. No  oropharyngeal exudate, posterior oropharyngeal edema, posterior oropharyngeal erythema or tonsillar abscesses.  Eyes: Conjunctivae are normal.  Cardiovascular: Normal rate and normal heart sounds.   Pulmonary/Chest: Effort normal. No accessory muscle usage. No respiratory distress. She has no wheezes. She has no rhonchi. She has no rales.  Coarse breath sounds with expiration, no wheezing. Adequate aeration.  Abdominal: Soft. There is no tenderness.  Musculoskeletal: Normal range of motion.  Neurological: She is alert and oriented to person, place, and time.  Skin: Skin is warm and dry. No rash noted.  Psychiatric: She has a normal mood and affect.    ED Course  Procedures (including critical care time) Labs Review Labs Reviewed - No data to display  Imaging Review No results found. I have personally reviewed and evaluated these images and lab results as part of my medical decision-making.   EKG Interpretation None      MDM   Final diagnoses:  Acute bronchitis, unspecified organism      Radiological studies were viewed, interpreted and considered during the medical decision making and disposition process. I agree with radiologists reading.  Results were also discussed with patient. Pt prescribed tessalon, albuterol mdi given with spacer for home use.  Naproxen for chest wall pain and inflammation.  Discussed smoking cessation.  Advised recheck here for increased sob, fever, increased weakness.  Referrals given for establishing pcp.  The patient appears reasonably screened and/or stabilized for discharge and I doubt any other medical condition or other [facility] requiring further screening, evaluation, or treatment in the ED at this time prior to discharge.      Burgess AmorJulie Deziah Renwick, PA-C 03/21/15 [id number]  Glynn OctaveStephen Rancour, MD 03/21/15 [phone]

## 2015-07-16 IMAGING — US US BREAST LTD UNI LEFT INC AXILLA
1 series · 4 of 4 positions shown · non-contrast
Comparison: None.

CLINICAL DATA: 23-year-old female complaining of a palpable
abnormality in the 11 o'clock region of the left breast. The patient
states that her physician palpated an abnormality in the 6 o'clock
region of the left breast.

EXAM:
ULTRASOUND OF THE LEFT BREAST

[Series 1: us breast ltd uni left inc axilla · 0.04mm/px · 4 of 4 slices shown]
[im 1/4]
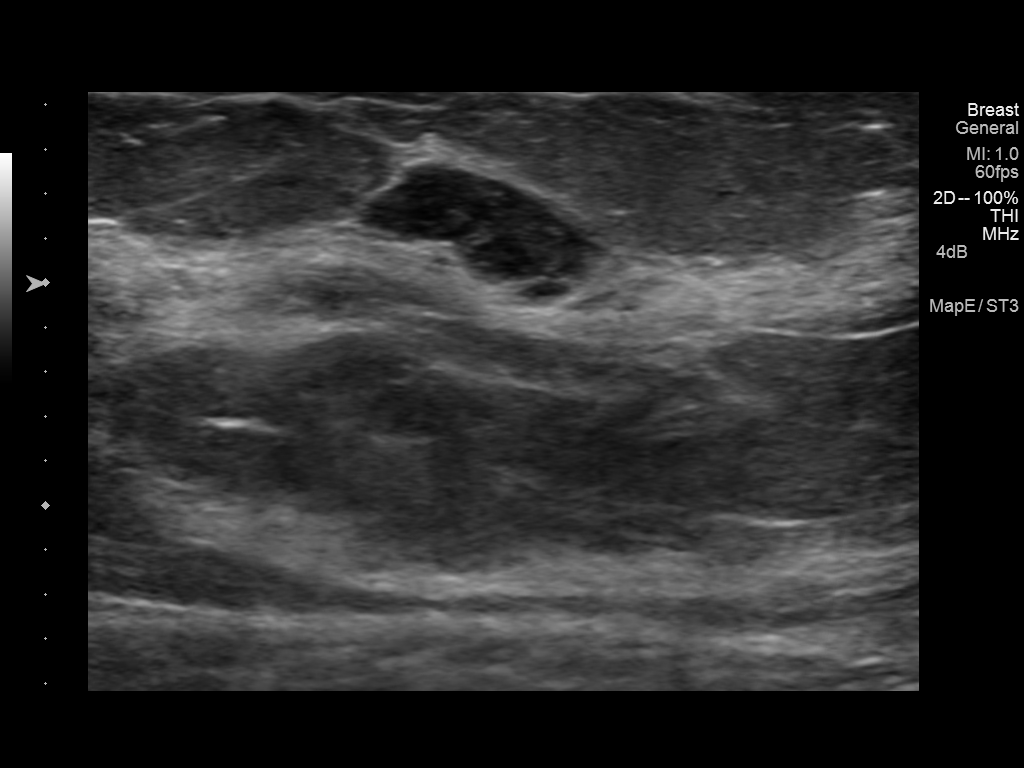
[im 2/4]
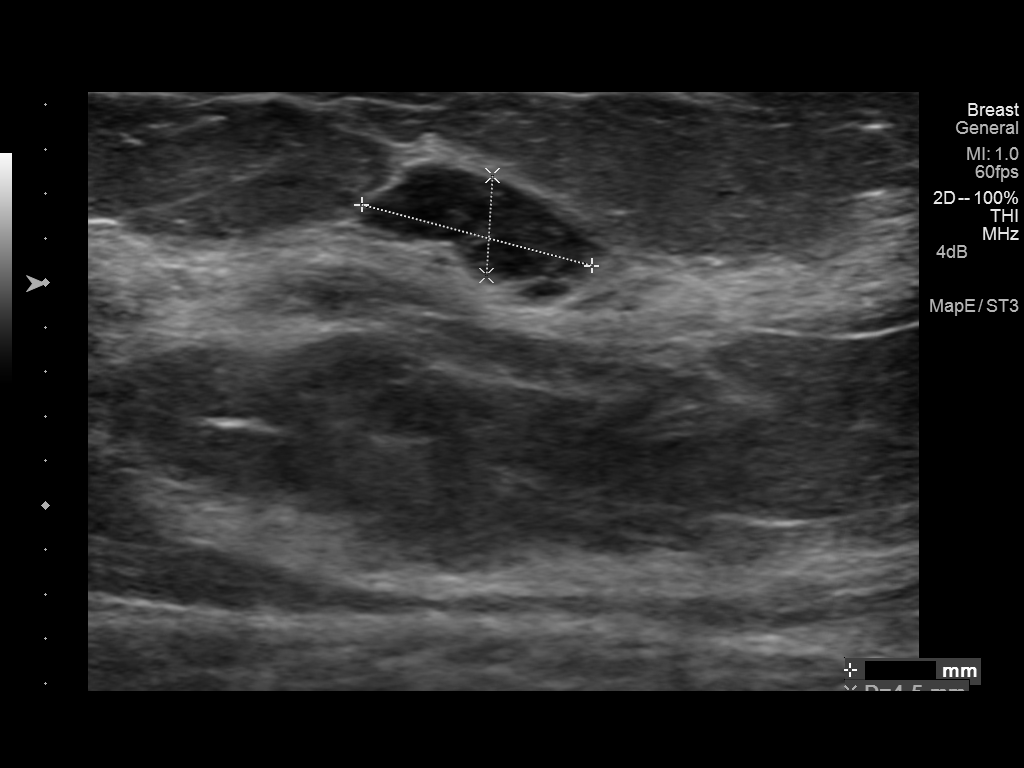
[im 3/4]
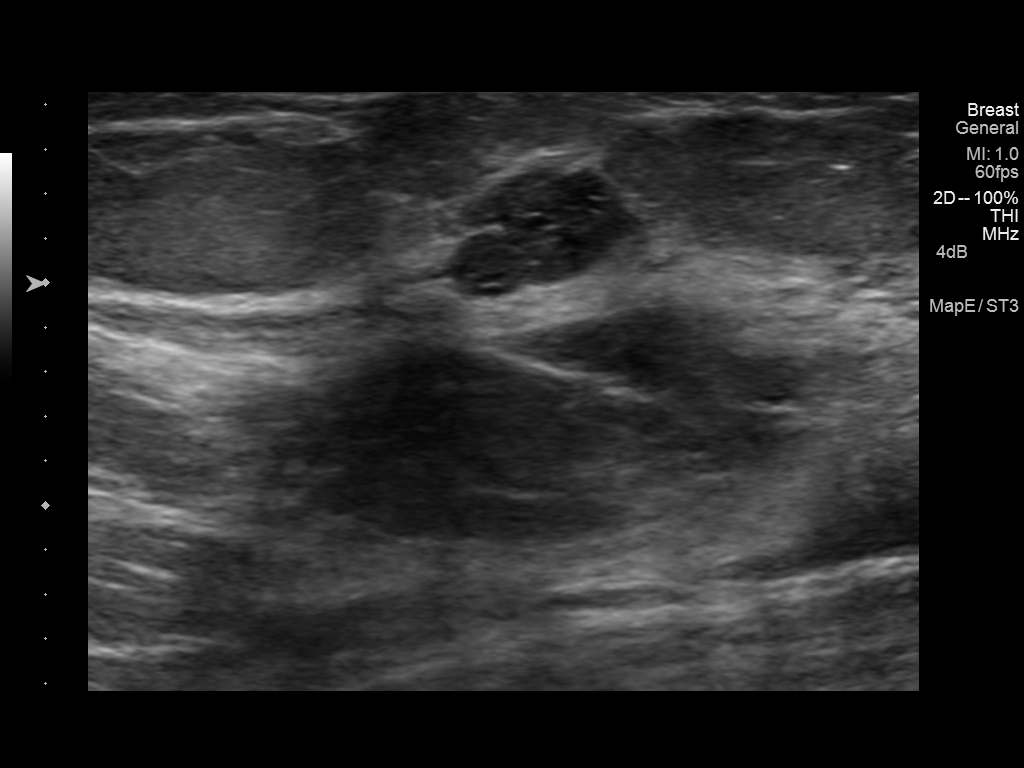
[im 4/4]
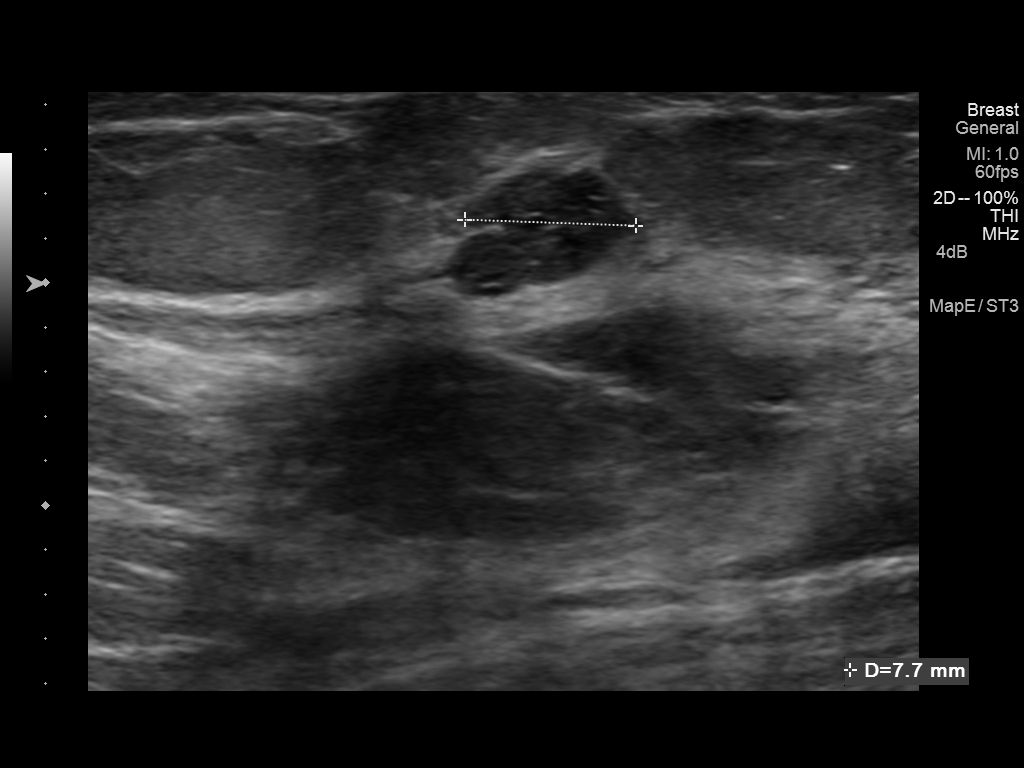

[4 of 4 positions shown; findings below may reference images not displayed]

FINDINGS: On physical exam, I palpate mild thickening in the left breast 11
o'clock 5 cm from the nipple. I do not palpate a mass in the 6
o'clock region of the left breast.

Ultrasound is performed, showing a gently lobulated,
well-circumscribed mass with increased through transmission in the
left breast at 11 o'clock 5 cm from the nipple. It measures 1.1 x
0.5 x 0.8 cm.
IMPRESSION: Probable benign fibroadenoma in the left breast.

RECOMMENDATION:
Short-term interval followup left breast ultrasound in 6 months,
ultrasound-guided core biopsy and surgical excision were discussed
with the patient. The patient opts for a short-term interval
followup ultrasound in 6 months. The importance of self-breast
examination was discussed with the patient. If she feels any
clinical changes she should return to our facility for additional
imaging evaluation.

I have discussed the findings and recommendations with the patient.
Results were also provided in writing at the conclusion of the
visit. If applicable, a reminder letter will be sent to the patient
regarding the next appointment.

BI-RADS CATEGORY  3: Probably benign finding(s) - short interval
follow-up suggested.

## 2015-08-24 ENCOUNTER — Encounter (HOSPITAL_COMMUNITY): Payer: Self-pay | Admitting: *Deleted

## 2015-08-24 ENCOUNTER — Emergency Department (HOSPITAL_COMMUNITY)
Admission: EM | Admit: 2015-08-24 | Discharge: 2015-08-24 | Disposition: A | Discharge status: Home / Self Care | Payer: Medicaid Other | Attending: Emergency Medicine | Admitting: Emergency Medicine

## 2015-08-24 DIAGNOSIS — F17210 Nicotine dependence, cigarettes, uncomplicated: Secondary | ICD-10-CM | POA: Insufficient documentation

## 2015-08-24 DIAGNOSIS — R21 Rash and other nonspecific skin eruption: Secondary | ICD-10-CM | POA: Diagnosis present

## 2015-08-24 DIAGNOSIS — L259 Unspecified contact dermatitis, unspecified cause: Secondary | ICD-10-CM

## 2015-08-24 MED ORDER — PREDNISONE 20 MG PO TABS: ORAL_TABLET | ORAL | Status: AC

## 2015-08-24 NOTE — Discharge Instructions (Signed)

## 2015-08-24 NOTE — ED Notes (Signed)
Pt c/o rash to arm, legs, and hips. Noticed on Thursday. Denies fevers, using ointments without relief.

## 2015-08-24 NOTE — ED Provider Notes (Signed)
CSN: [id number]     Arrival date & time 08/24/15  1448 History   First MD Initiated Contact with Patient 08/24/15 1524     Chief Complaint  Patient presents with  . Rash     (Consider location/radiation/quality/duration/timing/severity/associated sxs/prior Treatment) HPI   Heidi Trujillo is a 25 y.o. female who presents to the Emergency Department complaining of rash to bilateral arms, legs, and hips. She reports itching to the affected areas. Symptoms began 5 days ago after using a new type of lotion/oil. She's been applying over-the-counter first aid ointment without relief. She denies fever, pain, swelling, difficulty swallowing or breathing. Nothing makes the rash better or worse.   History reviewed. No pertinent past medical history. Past Surgical History  Procedure Laterality Date  . Cesarean section     History reviewed. No pertinent family history. Social History  Substance Use Topics  . Smoking status: Current Every Day Smoker    Types: Cigarettes  . Smokeless tobacco: None  . Alcohol Use: No   OB History    No data available     Review of Systems  Constitutional: Negative for fever, chills, activity change and appetite change.  HENT: Negative for facial swelling, sore throat and trouble swallowing.   Respiratory: Negative for chest tightness, shortness of breath and wheezing.   Musculoskeletal: Negative for neck pain and neck stiffness.  Skin: Positive for rash. Negative for wound.  Neurological: Negative for dizziness, weakness, numbness and headaches.  All other systems reviewed and are negative.     Allergies  Review of patient's allergies indicates no known allergies.  Home Medications   Prior to Admission medications   Medication Sig Start Date End Date Taking? Authorizing Provider  albuterol (PROVENTIL HFA;VENTOLIN HFA) 108 (90 BASE) MCG/ACT inhaler Inhale 1-2 puffs into the lungs every 6 (six) hours as needed for wheezing or shortness of breath.     Historical Provider, MD  Aspirin-Acetaminophen-Caffeine (GOODYS EXTRA STRENGTH PO) Take 1 Package by mouth daily as needed (pain).    Historical Provider, MD  benzonatate (TESSALON) 100 MG capsule Take 2 capsules (200 mg total) by mouth 3 (three) times daily as needed. 03/18/15   Burgess Amor, PA-C  ibuprofen (ADVIL,MOTRIN) 200 MG tablet Take 800 mg by mouth every 6 (six) hours as needed for pain.    Historical Provider, MD  ibuprofen (ADVIL,MOTRIN) 800 MG tablet Take 800 mg by mouth every 8 (eight) hours as needed for mild pain.    Historical Provider, MD  methocarbamol (ROBAXIN) 500 MG tablet Take 1 tablet (500 mg total) by mouth every 6 (six) hours as needed for muscle spasms (spasm headache). 03/21/15   Ivery Quale, PA-C  naproxen (NAPROSYN) 500 MG tablet Take 1 tablet (500 mg total) by mouth 2 (two) times daily. 03/18/15   Burgess Amor, PA-C   BP 117/71 mmHg  Pulse 66  Temp(Src) 98.1 F (36.7 C) (Temporal)  Resp 16  Ht  (1.626 m)  Wt 72.576 kg  BMI 27.45 kg/m2  SpO2 100% Physical Exam  Constitutional: She is oriented to person, place, and time. She appears well-developed and well-nourished. No distress.  HENT:  Head: Normocephalic and atraumatic.  Mouth/Throat: Oropharynx is clear and moist.  Neck: Normal range of motion. Neck supple.  Cardiovascular: Normal rate, regular rhythm, normal heart sounds and intact distal pulses.   No murmur heard. Pulmonary/Chest: Effort normal and breath sounds normal. No respiratory distress.  Musculoskeletal: She exhibits no edema or tenderness.  Lymphadenopathy:    She has  no cervical adenopathy.  Neurological: She is alert and oriented to person, place, and time. She exhibits normal muscle tone. Coordination normal.  Skin: Skin is warm. Rash noted. There is erythema.  Mild maculopapular rash to the bilateral antecubital fossa, bilateral upper thighs and posterior knees. No edema, vesicles, or drainage  Nursing note and vitals  reviewed.   ED Course  Procedures (including critical care time) Labs Review Labs Reviewed - No data to display  Imaging Review No results found. I have personally reviewed and evaluated these images and lab results as part of my medical decision-making.   EKG Interpretation None      MDM   Final diagnoses:  Contact dermatitis    Patient is well-appearing. Vital signs are stable. Afebrile. Rash appears consistent with dermatitis. Patient agrees to symptomatic treatment with over-the-counter Benadryl and prednisone    Pauline Ausammy Josephanthony Tindel, PA-C 08/24/15 1551  Donnetta HutchingBrian Cook, MD 08/24/15 [phone]

## 2015-09-19 ENCOUNTER — Emergency Department (HOSPITAL_COMMUNITY)
Admission: EM | Admit: 2015-09-19 | Discharge: 2015-09-19 | Disposition: A | Discharge status: Home / Self Care | Payer: Medicaid Other | Attending: Emergency Medicine | Admitting: Emergency Medicine

## 2015-09-19 ENCOUNTER — Encounter (HOSPITAL_COMMUNITY): Payer: Self-pay | Admitting: Emergency Medicine

## 2015-09-19 DIAGNOSIS — Z7982 Long term (current) use of aspirin: Secondary | ICD-10-CM | POA: Insufficient documentation

## 2015-09-19 DIAGNOSIS — Z79899 Other long term (current) drug therapy: Secondary | ICD-10-CM | POA: Insufficient documentation

## 2015-09-19 DIAGNOSIS — F17210 Nicotine dependence, cigarettes, uncomplicated: Secondary | ICD-10-CM | POA: Diagnosis not present

## 2015-09-19 DIAGNOSIS — K0889 Other specified disorders of teeth and supporting structures: Secondary | ICD-10-CM | POA: Insufficient documentation

## 2015-09-19 MED ORDER — LIDOCAINE VISCOUS 2 % MT SOLN: 10.0000 mL | OROMUCOSAL | Status: AC | PRN

## 2015-09-19 NOTE — Discharge Instructions (Signed)
Please use salt water swishes 3 or 4 times daily to the dental extraction site. May use lidocaine to the dental extraction site every 3 hours as needed. Please be cautious with eating and drinking after using the lidocaine, as it may cause numbness of the tongue, and some of the swallowing tissues. It is important that she see her dentist systems possible concerning this problem.

## 2015-09-19 NOTE — ED Notes (Signed)
Patient c/o right upper dental pain. Per patient had three teeth removed Monday. Patient states "I had two removed from the right upper side and now there is a bone protruding from the gums that has become quit painful." Patient reports taking Vicodin, ibuprofen, and penicillin with no relief. Patient also reports taking a tylenol flu and sinus medication this morning. Denies any fevers.

## 2015-09-19 NOTE — ED Provider Notes (Signed)
CSN: [id number]     Arrival date & time 09/19/15  1050 History  By signing my name below, I, Heidi Trujillo, attest that this documentation has been prepared under the direction and in the presence of Heidi QualeHobson Ashyah Quizon, PA-C.  Electronically Signed: Tanda RockersMargaux Trujillo, ED Scribe. 09/19/2015. 12:14 PM.   Chief Complaint  Patient presents with  . Dental Pain   The history is provided by the patient. No language interpreter was used.    HPI Comments: Heidi Trujillo is a 25 y.o. female who presents to the Emergency Department complaining of gradual onset, constant, moderate, right upper dental pain and swelling x 1 week. Pt mentions that she had 3 teeth pulled 1 week ago and now feels a bone protruding from the gums, causing the pain. She states that she has been unable to sleep or eat due to the pain. Pt also complains of sweats for the past 2 days. She does not have another dentist appointment until Thursday, 09/23/2015 (approximately 5 days from now) and could not wait any longer due to the pain. She has been taking Vicodin and Ibuprofen without relief. Pt is currently on Penicillin for the extractions. Denies fever, chills, or any other associated symptoms.   History reviewed. No pertinent past medical history. Past Surgical History  Procedure Laterality Date  . Cesarean section     History reviewed. No pertinent family history. Social History  Substance Use Topics  . Smoking status: Current Every Day Smoker -- 1.00 packs/day    Types: Cigarettes  . Smokeless tobacco: Never Used  . Alcohol Use: No   OB History    Gravida Para Term Preterm AB TAB SAB Ectopic Multiple Living   1 1 1       1      Review of Systems  Constitutional: Negative for fever and chills.  HENT: Positive for dental problem.   All other systems reviewed and are negative.  Allergies  Review of patient's allergies indicates no known allergies.  Home Medications   Prior to Admission medications   Medication Sig  Start Date End Date Taking? Authorizing Provider  HYDROcodone-acetaminophen (NORCO/VICODIN) 5-325 MG tablet Take 1 tablet by mouth every 6 (six) hours as needed. pain 09/13/15  Yes Historical Provider, MD  penicillin v potassium (VEETID) 500 MG tablet Take 500 mg by mouth 4 (four) times daily. Started 09/13/2015 09/13/15  Yes Historical Provider, MD  albuterol (PROVENTIL HFA;VENTOLIN HFA) 108 (90 BASE) MCG/ACT inhaler Inhale 1-2 puffs into the lungs every 6 (six) hours as needed for wheezing or shortness of breath.    Historical Provider, MD  Aspirin-Acetaminophen-Caffeine (GOODYS EXTRA STRENGTH PO) Take 1 Package by mouth daily as needed (pain).    Historical Provider, MD  benzonatate (TESSALON) 100 MG capsule Take 2 capsules (200 mg total) by mouth 3 (three) times daily as needed. Patient not taking: Reported on 09/19/2015 03/18/15   Burgess AmorJulie Idol, PA-C  ibuprofen (ADVIL,MOTRIN) 200 MG tablet Take 800 mg by mouth every 6 (six) hours as needed for pain.    Historical Provider, MD  ibuprofen (ADVIL,MOTRIN) 800 MG tablet Take 800 mg by mouth every 8 (eight) hours as needed for mild pain.    Historical Provider, MD  methocarbamol (ROBAXIN) 500 MG tablet Take 1 tablet (500 mg total) by mouth every 6 (six) hours as needed for muscle spasms (spasm headache). Patient not taking: Reported on 09/19/2015 03/21/15   Heidi QualeHobson Norris Brumbach, PA-C  naproxen (NAPROSYN) 500 MG tablet Take 1 tablet (500 mg total) by mouth 2 (  two) times daily. Patient not taking: Reported on 09/19/2015 03/18/15   Burgess Amor, PA-C  predniSONE (DELTASONE) 20 MG tablet Two tabs po qd x 5 days Patient not taking: Reported on 09/19/2015 08/24/15   [name], PA-C   BP 128/83 mmHg  Pulse 86  Temp(Src) 98.5 F (36.9 C) (Oral)  Resp 16  Ht  (1.626 m)  Wt 170 lb (77.111 kg)  BMI 29.17 kg/m2  SpO2 100%   Physical Exam  Constitutional: She is oriented to person, place, and time. She appears well-developed and well-nourished. No distress.  HENT:   Head: Normocephalic and atraumatic.  There is healing gum from two recent extractions in the right upper gum area.  There is mild to moderate swelling present.  There is no evidence of abscess.  There is no active drainage.  There is tenderness to palpation.  No swelling under the tongue.  Airway is patent.  There are no temperature changes of the face.   Eyes: Conjunctivae and EOM are normal.  Neck: Normal range of motion. Neck supple. No tracheal deviation present.  No cervical lymphadenopathy.   Cardiovascular: Normal rate, regular rhythm and normal heart sounds.   No murmur heard. Pulmonary/Chest: Effort normal and breath sounds normal. No respiratory distress. She has no wheezes. She has no rales.  Musculoskeletal: Normal range of motion.  Lymphadenopathy:    She has no cervical adenopathy.  Neurological: She is alert and oriented to person, place, and time.  Skin: Skin is warm and dry.  Psychiatric: She has a normal mood and affect. Her behavior is normal.  Nursing note and vitals reviewed.   ED Course  Procedures (including critical care time)  DIAGNOSTIC STUDIES: Oxygen Saturation is 100% on RA, normal by my interpretation.    COORDINATION OF CARE: 12:10 PM-Discussed treatment plan which includes follow up with dentist with pt at bedside and pt agreed to plan.    MDM  Vital signs within normal limits. Patient feels that there is a "piece of bone" coming through her gum. She feels that this is related to extractions that were recently made on the right. I do not see any slit there is bone. The patient does have swelling and tenderness at the site, but I do not feel anything to stick in my gloved finger on examination. Is no visible abscess present. Patient is to continue her current medications. She will treat the area with 2% lidocaine solution until she is seen by her dentist in the next 24-48 hours.    Final diagnoses:  Pain, dental    **I have reviewed nursing  notes, vital signs, and all appropriate lab and imaging results for this patient.Heidi Quale, PA-C 09/20/15 2131  Donnetta Hutching, MD 09/22/15 1150

## 2015-12-22 ENCOUNTER — Other Ambulatory Visit: Payer: Self-pay | Admitting: Adult Health

## 2016-01-03 ENCOUNTER — Other Ambulatory Visit: Payer: Self-pay | Admitting: Adult Health

## 2016-08-20 IMAGING — DX DG CHEST 2V
2 series · 2 of 2 positions shown · non-contrast
Comparison: 01/31/2013

CLINICAL DATA: Productive cough for 1 week.

EXAM:
CHEST  2 VIEW

[chest pa]
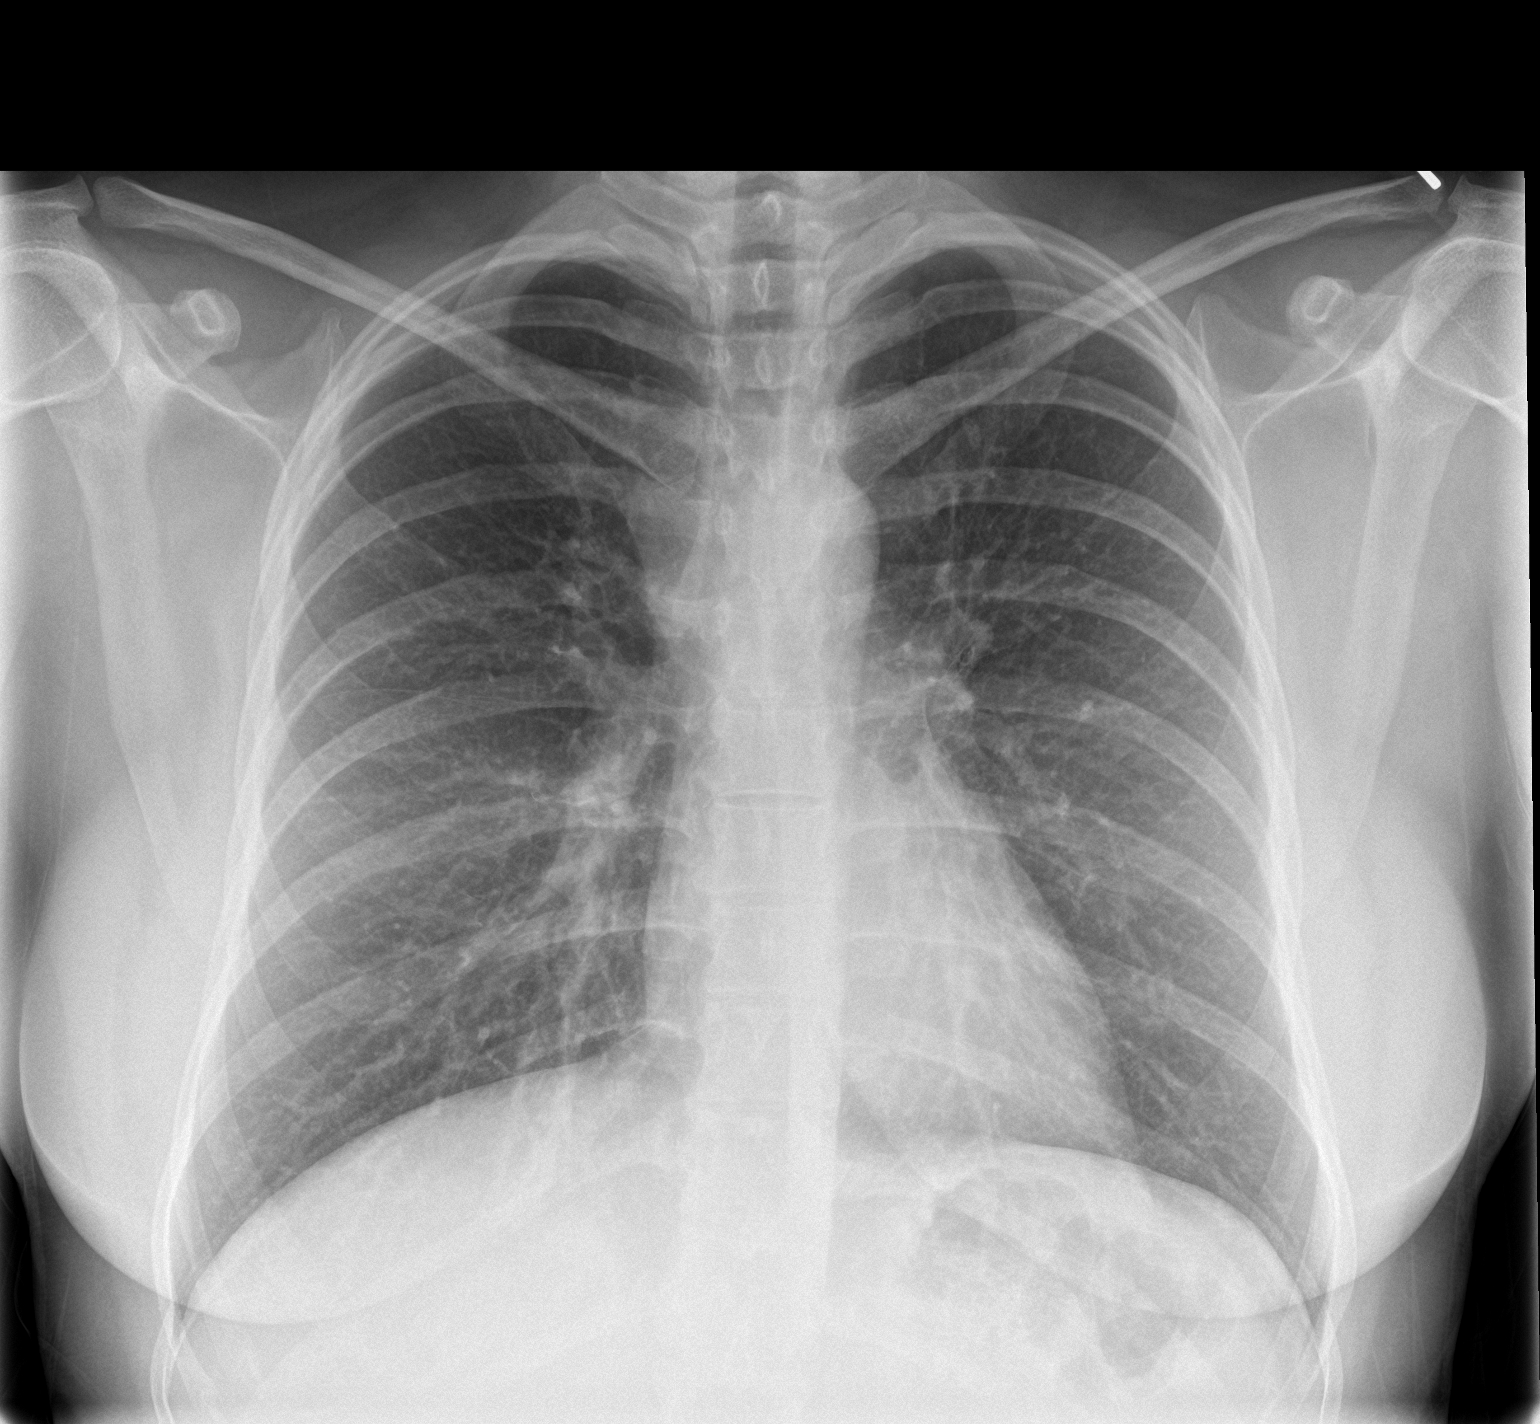

[chest lat]
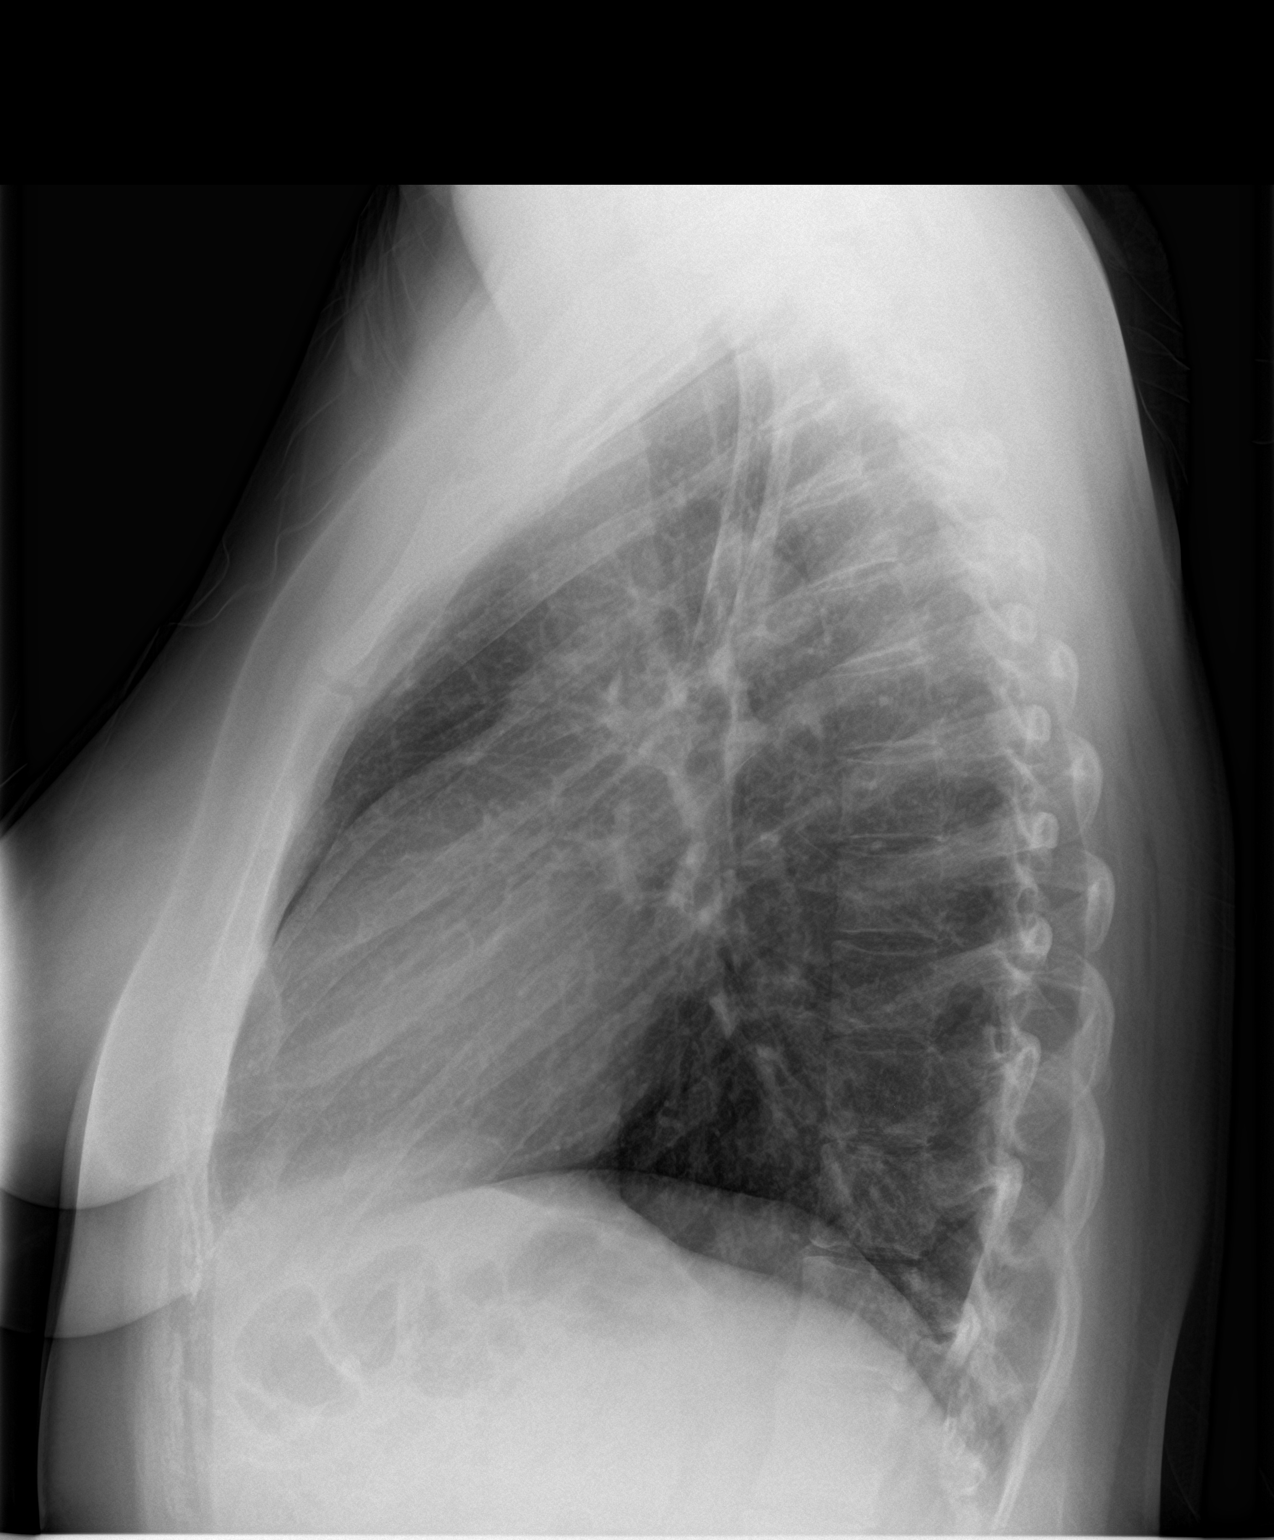

[2 of 2 positions shown; findings below may reference images not displayed]

FINDINGS: The cardiomediastinal contours are normal. Mild bronchial
thickening, no pneumonia. Pulmonary vasculature is normal. No
pleural effusion or pneumothorax. No acute osseous abnormalities are
seen.
IMPRESSION: Bronchial thickening suggestive of bronchitis.  No pneumonia.
